# Patient Record
Sex: Female | Born: 1945 | Race: White | Hispanic: No | Smoking: Former smoker
Health system: Southern US, Community
[De-identification: ages and names within clinical notes are randomized; demographics above are authoritative.]

## PROBLEM LIST (undated history)

## (undated) DIAGNOSIS — R0600 Dyspnea, unspecified: Secondary | ICD-10-CM

## (undated) DIAGNOSIS — F419 Anxiety disorder, unspecified: Secondary | ICD-10-CM

## (undated) DIAGNOSIS — J189 Pneumonia, unspecified organism: Secondary | ICD-10-CM

## (undated) DIAGNOSIS — J449 Chronic obstructive pulmonary disease, unspecified: Secondary | ICD-10-CM

---

## 1997-11-28 ENCOUNTER — Ambulatory Visit (HOSPITAL_BASED_OUTPATIENT_CLINIC_OR_DEPARTMENT_OTHER): Admission: RE | Admit: 1997-11-28 | Discharge: 1997-11-28 | Payer: Self-pay | Admitting: Orthopedic Surgery

## 1998-10-19 ENCOUNTER — Encounter: Payer: Self-pay | Admitting: Orthopedic Surgery

## 1998-10-23 ENCOUNTER — Inpatient Hospital Stay (HOSPITAL_COMMUNITY): Admission: RE | Admit: 1998-10-23 | Discharge: 1998-10-27 | Payer: Self-pay | Admitting: Orthopedic Surgery

## 2017-08-15 ENCOUNTER — Emergency Department (HOSPITAL_COMMUNITY): Payer: Medicare Other

## 2017-08-15 ENCOUNTER — Inpatient Hospital Stay (HOSPITAL_COMMUNITY)
Admission: EM | Admit: 2017-08-15 | Discharge: 2017-09-10 | DRG: 208 | Disposition: E | Payer: Medicare Other | Attending: Pulmonary Disease | Admitting: Pulmonary Disease

## 2017-08-15 ENCOUNTER — Other Ambulatory Visit: Payer: Self-pay

## 2017-08-15 DIAGNOSIS — J84112 Idiopathic pulmonary fibrosis: Secondary | ICD-10-CM

## 2017-08-15 DIAGNOSIS — Z96659 Presence of unspecified artificial knee joint: Secondary | ICD-10-CM | POA: Diagnosis present

## 2017-08-15 DIAGNOSIS — I2722 Pulmonary hypertension due to left heart disease: Secondary | ICD-10-CM | POA: Diagnosis present

## 2017-08-15 DIAGNOSIS — J439 Emphysema, unspecified: Secondary | ICD-10-CM | POA: Diagnosis present

## 2017-08-15 DIAGNOSIS — R5381 Other malaise: Secondary | ICD-10-CM | POA: Diagnosis present

## 2017-08-15 DIAGNOSIS — I10 Essential (primary) hypertension: Secondary | ICD-10-CM | POA: Diagnosis present

## 2017-08-15 DIAGNOSIS — F419 Anxiety disorder, unspecified: Secondary | ICD-10-CM | POA: Diagnosis present

## 2017-08-15 DIAGNOSIS — Z833 Family history of diabetes mellitus: Secondary | ICD-10-CM

## 2017-08-15 DIAGNOSIS — J9620 Acute and chronic respiratory failure, unspecified whether with hypoxia or hypercapnia: Secondary | ICD-10-CM | POA: Diagnosis present

## 2017-08-15 DIAGNOSIS — Z515 Encounter for palliative care: Secondary | ICD-10-CM | POA: Diagnosis not present

## 2017-08-15 DIAGNOSIS — L89301 Pressure ulcer of unspecified buttock, stage 1: Secondary | ICD-10-CM | POA: Diagnosis present

## 2017-08-15 DIAGNOSIS — Z87891 Personal history of nicotine dependence: Secondary | ICD-10-CM | POA: Diagnosis not present

## 2017-08-15 DIAGNOSIS — J9621 Acute and chronic respiratory failure with hypoxia: Principal | ICD-10-CM

## 2017-08-15 DIAGNOSIS — Z9689 Presence of other specified functional implants: Secondary | ICD-10-CM

## 2017-08-15 DIAGNOSIS — I959 Hypotension, unspecified: Secondary | ICD-10-CM | POA: Diagnosis present

## 2017-08-15 DIAGNOSIS — Z66 Do not resuscitate: Secondary | ICD-10-CM | POA: Diagnosis not present

## 2017-08-15 DIAGNOSIS — G9341 Metabolic encephalopathy: Secondary | ICD-10-CM | POA: Diagnosis present

## 2017-08-15 DIAGNOSIS — E785 Hyperlipidemia, unspecified: Secondary | ICD-10-CM | POA: Diagnosis present

## 2017-08-15 DIAGNOSIS — J939 Pneumothorax, unspecified: Secondary | ICD-10-CM | POA: Diagnosis not present

## 2017-08-15 DIAGNOSIS — Z8249 Family history of ischemic heart disease and other diseases of the circulatory system: Secondary | ICD-10-CM | POA: Diagnosis not present

## 2017-08-15 DIAGNOSIS — J9690 Respiratory failure, unspecified, unspecified whether with hypoxia or hypercapnia: Secondary | ICD-10-CM | POA: Diagnosis present

## 2017-08-15 DIAGNOSIS — D649 Anemia, unspecified: Secondary | ICD-10-CM | POA: Diagnosis present

## 2017-08-15 DIAGNOSIS — J9383 Other pneumothorax: Secondary | ICD-10-CM | POA: Diagnosis present

## 2017-08-15 DIAGNOSIS — Z8673 Personal history of transient ischemic attack (TIA), and cerebral infarction without residual deficits: Secondary | ICD-10-CM

## 2017-08-15 DIAGNOSIS — L8990 Pressure ulcer of unspecified site, unspecified stage: Secondary | ICD-10-CM

## 2017-08-15 HISTORY — DX: Pneumonia, unspecified organism: J18.9

## 2017-08-15 HISTORY — DX: Anxiety disorder, unspecified: F41.9

## 2017-08-15 HISTORY — DX: Chronic obstructive pulmonary disease, unspecified: J44.9

## 2017-08-15 HISTORY — DX: Dyspnea, unspecified: R06.00

## 2017-08-15 LAB — BLOOD GAS, ARTERIAL
Acid-base deficit: 3 mmol/L — ABNORMAL HIGH (ref 0.0–2.0)
Bicarbonate: 21.5 mmol/L (ref 20.0–28.0)
DRAWN BY: 308601
FIO2: 100
LHR: 20 {breaths}/min
O2 SAT: 99.6 %
PATIENT TEMPERATURE: 36.3
PCO2 ART: 37 mmHg (ref 32.0–48.0)
PEEP: 8 cmH2O
PH ART: 7.378 (ref 7.350–7.450)
PO2 ART: 208 mmHg — AB (ref 83.0–108.0)
Pressure control: 14 cmH2O

## 2017-08-15 LAB — CBC WITH DIFFERENTIAL/PLATELET
Basophils Absolute: 0 10*3/uL (ref 0.0–0.1)
Basophils Relative: 0 %
Eosinophils Absolute: 0 10*3/uL (ref 0.0–0.7)
Eosinophils Relative: 0 %
HCT: 34.3 % — ABNORMAL LOW (ref 36.0–46.0)
Hemoglobin: 10.9 g/dL — ABNORMAL LOW (ref 12.0–15.0)
Lymphocytes Relative: 6 %
Lymphs Abs: 0.7 10*3/uL (ref 0.7–4.0)
MCH: 28.2 pg (ref 26.0–34.0)
MCHC: 31.8 g/dL (ref 30.0–36.0)
MCV: 88.9 fL (ref 78.0–100.0)
Monocytes Absolute: 0.3 10*3/uL (ref 0.1–1.0)
Monocytes Relative: 3 %
Neutro Abs: 11.4 10*3/uL — ABNORMAL HIGH (ref 1.7–7.7)
Neutrophils Relative %: 91 %
Platelets: 370 10*3/uL (ref 150–400)
RBC: 3.86 MIL/uL — ABNORMAL LOW (ref 3.87–5.11)
RDW: 15.3 % (ref 11.5–15.5)
WBC: 12.4 10*3/uL — ABNORMAL HIGH (ref 4.0–10.5)

## 2017-08-15 LAB — COMPREHENSIVE METABOLIC PANEL
ALBUMIN: 2.7 g/dL — AB (ref 3.5–5.0)
ALT: 11 U/L — AB (ref 14–54)
AST: 16 U/L (ref 15–41)
Alkaline Phosphatase: 52 U/L (ref 38–126)
Anion gap: 11 (ref 5–15)
BILIRUBIN TOTAL: 0.8 mg/dL (ref 0.3–1.2)
BUN: 18 mg/dL (ref 6–20)
CHLORIDE: 105 mmol/L (ref 101–111)
CO2: 27 mmol/L (ref 22–32)
CREATININE: 0.64 mg/dL (ref 0.44–1.00)
Calcium: 9.2 mg/dL (ref 8.9–10.3)
GFR calc Af Amer: >60 mL/min (ref >60)
GLUCOSE: 129 mg/dL — AB (ref 65–99)
POTASSIUM: 3.7 mmol/L (ref 3.5–5.1)
Sodium: 143 mmol/L (ref 135–145)
Total Protein: 6.5 g/dL (ref 6.5–8.1)

## 2017-08-15 LAB — I-STAT CG4 LACTIC ACID, ED: Lactic Acid, Venous: 0.86 mmol/L (ref 0.5–1.9)

## 2017-08-15 LAB — TROPONIN I: Troponin I: <0.03 ng/mL (ref <0.03)

## 2017-08-15 LAB — BRAIN NATRIURETIC PEPTIDE: B Natriuretic Peptide: 61.1 pg/mL (ref 0.0–100.0)

## 2017-08-15 LAB — MRSA PCR SCREENING: MRSA BY PCR: NEGATIVE

## 2017-08-15 LAB — PROCALCITONIN: Procalcitonin: 0.1 ng/mL

## 2017-08-15 LAB — TRIGLYCERIDES: TRIGLYCERIDES: 176 mg/dL — AB (ref <150)

## 2017-08-15 MED ORDER — PANTOPRAZOLE SODIUM 40 MG PO PACK
40.0000 mg | PACK | ORAL | Status: DC
  Filled 2017-08-15 (×2): qty 20

## 2017-08-15 MED ORDER — SODIUM CHLORIDE 0.9 % IV SOLN
INTRAVENOUS | Status: DC
  Administered 2017-08-15: 22:00:00 via INTRAVENOUS

## 2017-08-15 MED ORDER — IPRATROPIUM-ALBUTEROL 0.5-2.5 (3) MG/3ML IN SOLN
3.0000 mL | Freq: Four times a day (QID) | RESPIRATORY_TRACT | Status: DC
  Administered 2017-08-15 – 2017-08-17 (×8): 3 mL via RESPIRATORY_TRACT
  Filled 2017-08-15 (×8): qty 3

## 2017-08-15 MED ORDER — FENTANYL CITRATE (PF) 100 MCG/2ML IJ SOLN
75.0000 ug | Freq: Once | INTRAMUSCULAR | Status: AC
  Administered 2017-08-15: 75 ug via INTRAVENOUS
  Filled 2017-08-15: qty 2

## 2017-08-15 MED ORDER — ENOXAPARIN SODIUM 40 MG/0.4ML ~~LOC~~ SOLN
40.0000 mg | SUBCUTANEOUS | Status: DC
  Administered 2017-08-15 – 2017-08-17 (×3): 40 mg via SUBCUTANEOUS
  Filled 2017-08-15 (×4): qty 0.4

## 2017-08-15 MED ORDER — HYDROCORTISONE NA SUCCINATE PF 100 MG IJ SOLR
100.0000 mg | Freq: Once | INTRAMUSCULAR | Status: AC
  Administered 2017-08-15: 100 mg via INTRAVENOUS
  Filled 2017-08-15: qty 2

## 2017-08-15 MED ORDER — PREDNISONE 10 MG PO TABS: 20.0000 mg | ORAL_TABLET | Freq: Every day | ORAL | Status: DC

## 2017-08-15 MED ORDER — IOPAMIDOL (ISOVUE-370) INJECTION 76%
INTRAVENOUS | Status: AC
  Filled 2017-08-15: qty 100

## 2017-08-15 MED ORDER — SUCCINYLCHOLINE CHLORIDE 20 MG/ML IJ SOLN
60.0000 mg | Freq: Once | INTRAMUSCULAR | Status: AC
  Administered 2017-08-15: 60 mg via INTRAVENOUS
  Filled 2017-08-15: qty 3

## 2017-08-15 MED ORDER — FENTANYL CITRATE (PF) 100 MCG/2ML IJ SOLN
50.0000 ug | INTRAMUSCULAR | Status: DC | PRN
  Administered 2017-08-16: 50 ug via INTRAVENOUS
  Filled 2017-08-15: qty 2

## 2017-08-15 MED ORDER — FENTANYL CITRATE (PF) 100 MCG/2ML IJ SOLN: 50.0000 ug | INTRAMUSCULAR | Status: DC | PRN

## 2017-08-15 MED ORDER — ORAL CARE MOUTH RINSE
15.0000 mL | Freq: Four times a day (QID) | OROMUCOSAL | Status: DC
  Administered 2017-08-16 – 2017-08-18 (×10): 15 mL via OROMUCOSAL

## 2017-08-15 MED ORDER — VANCOMYCIN HCL IN DEXTROSE 1-5 GM/200ML-% IV SOLN
1000.0000 mg | INTRAVENOUS | Status: DC
  Administered 2017-08-15: 1000 mg via INTRAVENOUS
  Filled 2017-08-15: qty 200

## 2017-08-15 MED ORDER — ETOMIDATE 2 MG/ML IV SOLN
16.0000 mg | Freq: Once | INTRAVENOUS | Status: AC
  Administered 2017-08-15: 16 mg via INTRAVENOUS
  Filled 2017-08-15: qty 10

## 2017-08-15 MED ORDER — PROPOFOL 1000 MG/100ML IV EMUL
0.0000 ug/kg/min | INTRAVENOUS | Status: DC
  Administered 2017-08-15: 40 ug/kg/min via INTRAVENOUS
  Administered 2017-08-16 (×2): 20 ug/kg/min via INTRAVENOUS
  Administered 2017-08-17 (×2): 30 ug/kg/min via INTRAVENOUS
  Administered 2017-08-17: 20 ug/kg/min via INTRAVENOUS
  Administered 2017-08-18: 30 ug/kg/min via INTRAVENOUS
  Filled 2017-08-15 (×3): qty 100
  Filled 2017-08-15: qty 200
  Filled 2017-08-15 (×2): qty 100

## 2017-08-15 MED ORDER — BISACODYL 10 MG RE SUPP: 10.0000 mg | Freq: Every day | RECTAL | Status: DC | PRN

## 2017-08-15 MED ORDER — MIDAZOLAM HCL 2 MG/2ML IJ SOLN
1.0000 mg | INTRAMUSCULAR | Status: DC | PRN
  Administered 2017-08-16 – 2017-08-17 (×4): 1 mg via INTRAVENOUS
  Administered 2017-08-17: 2 mg via INTRAVENOUS
  Filled 2017-08-15 (×6): qty 2

## 2017-08-15 MED ORDER — FENTANYL CITRATE (PF) 100 MCG/2ML IJ SOLN
50.0000 ug | INTRAMUSCULAR | Status: DC | PRN
  Administered 2017-08-15: 50 ug via INTRAVENOUS
  Filled 2017-08-15: qty 2

## 2017-08-15 MED ORDER — ASPIRIN 81 MG PO CHEW: 81.0000 mg | CHEWABLE_TABLET | Freq: Every day | ORAL | Status: DC

## 2017-08-15 MED ORDER — SODIUM CHLORIDE 0.9 % IV BOLUS
1000.0000 mL | Freq: Once | INTRAVENOUS | Status: AC
  Administered 2017-08-15: 1000 mL via INTRAVENOUS

## 2017-08-15 MED ORDER — CHLORHEXIDINE GLUCONATE 0.12% ORAL RINSE (MEDLINE KIT)
15.0000 mL | Freq: Two times a day (BID) | OROMUCOSAL | Status: DC
  Administered 2017-08-15 – 2017-08-18 (×6): 15 mL via OROMUCOSAL

## 2017-08-15 MED ORDER — PROPOFOL 1000 MG/100ML IV EMUL
0.0000 ug/kg/min | INTRAVENOUS | Status: DC
  Administered 2017-08-15: 5 ug/kg/min via INTRAVENOUS
  Filled 2017-08-15: qty 100

## 2017-08-15 MED ORDER — DOCUSATE SODIUM 50 MG/5ML PO LIQD
100.0000 mg | Freq: Two times a day (BID) | ORAL | Status: DC | PRN
  Filled 2017-08-15: qty 10

## 2017-08-15 MED ORDER — ATORVASTATIN CALCIUM 20 MG PO TABS: 20.0000 mg | ORAL_TABLET | Freq: Every day | ORAL | Status: DC

## 2017-08-15 MED ORDER — IOPAMIDOL (ISOVUE-370) INJECTION 76%
100.0000 mL | Freq: Once | INTRAVENOUS | Status: AC | PRN
  Administered 2017-08-16: 69 mL via INTRAVENOUS

## 2017-08-15 MED ORDER — SODIUM CHLORIDE 0.9 % IV SOLN
2.0000 g | Freq: Once | INTRAVENOUS | Status: AC
  Administered 2017-08-15: 2 g via INTRAVENOUS
  Filled 2017-08-15: qty 2

## 2017-08-15 NOTE — ED Triage Notes (Signed)
Per [facility], patient received report from the family as the RN didn't know the history. Pt was transferred from [facility] to [facility] and since Tuesday was dx was pnemonia. Hx puml fibrosis. Respiratory distress started this AM. RR low 40s on bipap. 93% on bipap. desats quickly when taken off. Alert and oriented.

## 2017-08-15 NOTE — ED Notes (Signed)
Coming over from [facility]-states she was admitted there for respiratory failure-states she is unable to maintain saturation/O2-states she is currently on bipap-states xray showed pneumonia -states patient needs to be intubated and possibly admitted

## 2017-08-15 NOTE — ED Notes (Signed)
Respiratory aware of ABG

## 2017-08-15 NOTE — Progress Notes (Signed)
PHARMACY CONSULT: Lovenox for VTE prophylaxis   Wt: 55.8kg BMI:  21 Scr:  0.64 CrCl >30 ml/hr  H/H: 10.9/34.3 Pltc: 370  A/P:  Appropriate for standard dose Lovenox 40mg  sq q24h  No further dose adjustments needed, Pharmacy will sign off   Loralee PacasErin Dante Cooter, PharmD, BCPS Pager: [phone] 08/20/2017 6:00 PM

## 2017-08-15 NOTE — Progress Notes (Signed)
Pt on ventilator and unable to answer questions on nursing admission hx. Attempted to reach pt's sister by phone to get her assistance in answering the questions. There was no answer. Pt's rn notified. Briscoe Burns. Carleane Devon Pretty BSN, RN-BC Admissions RN 08/14/2017 8:02 PM

## 2017-08-15 NOTE — H&P (Signed)
PULMONARY / CRITICAL CARE MEDICINE   Name: Nancy Potts MRN: [id number] DOB: 09/18/1945    ADMISSION DATE:  08/24/2017  REFERRING MD:  Dr. Erma HeritageIsaacs, ER  CHIEF COMPLAINT:  Shortness of breath  HISTORY OF PRESENT ILLNESS:   Hx from chart.  72 yo female transferred from [facility] with respiratory failure.  She was hypoxic in ER and required intubation.  She was treated recently for pneumonia.  It does not appear she has ever been seen by a pulmonologist.  Her chest imaging studies show advanced pulmonary fibrosis with UIP pattern.  Apparently, she had discussion regarding goals of care and wanted aggressive measures.   PAST MEDICAL HISTORY :  COPD, Pulmonary fibrosis, Pulmonary Hypertension, Dysphagia, Hypertension, Hyperlipidemia, CVA, Rt renal cyst, Anxiety, Thrombocytosis, E coli bactermia  PAST SURGICAL HISTORY: Hysterectomy, knee replacement, tonsillectomy  Allergies  Allergen Reactions  . Amoxicillin Other (See Comments)    Unknown; has tolerated cephalosporins in the past and has had zosyn in ED without noted issue  . Fenofibrate Micronized     Other reaction(s): Dizziness (intolerance)  . Valproic Acid Other (See Comments)    Unknown    OUTPATIENT MEDICATIONS: Xanax, Ambien, Aspirin, Flonase, Fluoxetine 40 mg daily, Gabapentin 100 mg q6h prn, duoneb, levaquin, lipitor 20 mg daily, prednisone 20 mg daily, protonix, vancomycin   FAMILY HISTORY:  CAD, DM, HLD, Cancer, Dementia  SOCIAL HISTORY: Quit smoking in 2006  REVIEW OF SYSTEMS:   Unable to obtain  SUBJECTIVE:   VITAL SIGNS: BP (!) 100/44   Pulse 86   Temp 98.2 F (36.8 C) (Oral)   Resp (!) 21   Ht 5\' 4"  (1.626 m)   Wt 123 lb (55.8 kg)   SpO2 92%   BMI 21.11 kg/m   VENTILATOR SETTINGS: Vent Mode: PRVC FiO2 (%):  [100 %] 100 % Set Rate:  [10 bmp-15 bmp] 15 bmp Vt Set:  [440 mL] 440 mL PEEP:  [6 cmH20-10 cmH20] 10 cmH20 Plateau Pressure:  [20 cmH20] 20 cmH20  INTAKE / OUTPUT: No intake/output data  recorded.  PHYSICAL EXAMINATION:  General - sedated Eyes - pupils reactive ENT - no sinus tenderness, no oral exudate, no LAN Cardiac - regular, no murmur Chest - b/l crackles Abd - soft, non tender Ext - decreased muscle bulk Skin - no rashes Neuro - RASS -2  LABS:  BMET Recent Labs  Lab 03/24/18 1624  NA 143  K 3.7  CL 105  CO2 27  BUN 18  CREATININE 0.64  GLUCOSE 129*    Electrolytes Recent Labs  Lab 03/24/18 1624  CALCIUM 9.2    CBC Recent Labs  Lab 03/24/18 1624  WBC 12.4*  HGB 10.9*  HCT 34.3*  PLT 370    Coag's No results for input(s): APTT, INR in the last 168 hours.  Sepsis Markers Recent Labs  Lab 03/24/18 1631  LATICACIDVEN 0.86    ABG No results for input(s): PHART, PCO2ART, PO2ART in the last 168 hours.  Liver Enzymes Recent Labs  Lab 03/24/18 1624  AST 16  ALT 11*  ALKPHOS 52  BILITOT 0.8  ALBUMIN 2.7*    Cardiac Enzymes Recent Labs  Lab 03/24/18 1624  TROPONINI <0.03    Glucose No results for input(s): GLUCAP in the last 168 hours.  Imaging Dg Chest Portable 1 View  Result Date: 09/08/2017 CLINICAL DATA:  Shortness of breath. EXAM: PORTABLE CHEST 1 VIEW COMPARISON:  Radiograph of June 29, 2017. FINDINGS: The heart size and mediastinal contours are within normal limits.  Endotracheal tube is seen projected over tracheal air shadow with distal tip 3 cm above the carina. No pneumothorax or pleural effusion is noted. Stable diffuse reticular densities are noted throughout both lungs consistent with chronic interstitial lung disease, although acute superimposed edema or inflammation cannot be excluded. The visualized skeletal structures are unremarkable. IMPRESSION: Endotracheal tube in grossly good position. Stable reticular densities throughout both lungs consistent with chronic interstitial lung disease, although acute edema or inflammation cannot be excluded. Electronically Signed   By: Lupita Raider, M.D.   On:  September 01, 2017 16:56     STUDIES:   CULTURES: Blood 4/05 >>   ANTIBIOTICS: Vancomycin 4/05 >> 4/05 Cefepime 4/05 >> 4/05  SIGNIFICANT EVENTS: 4/05 Transfer from [facility] to [facility]  LINES/TUBES: ETT 4/05  DISCUSSION: 72 yo female former smoker with hx of advanced pulmonary fibrosis and COPD sent to ER from [facility] with worsening dyspnea and hypoxia.  She required intubation.  It is not clear whether there was an acute inciting event or this is the progression of her disease.  She was recently treated with ABx for bacteremia, and has been on prednisone.  She does not appear volume overloaded.  It is possible she could have had a PE.  She appears rather debilitated, and does not appear that she has walked anytime recently.  ASSESSMENT / PLAN:  Acute on chronic hypoxic respiratory failure. Advanced pulmonary fibrosis with UIP pattern. COPD. WHO group 2 pulmonary hypertension. - full vent support - change to pressure control - adjust PEEP/FiO2 to keep SpO2 88 to 95% - CT angio chest - continue prednisone 20 mg daily - defer additional ABx for now - check procalcitonin - scheduled BDs  Acute metabolic encephalopathy. Hx of CVA, anxiety. - RASS goal 0 to -1 - continue ASA - hold outpt fluoxetine, gabapentin  Hypotension. - likely from sedation and hypovolemia - IV fluids  Hx of HLD. - lipitor  DVT prophylaxis - lovenox SUP - protonix Nutrition - NPO Goals of care - full code  CC time 38 minutes  Coralyn Helling, MD [facility] Pulmonary/Critical Care 2017-09-01, 6:06 PM

## 2017-08-15 NOTE — Progress Notes (Signed)
This RN and another RN each tried twice to start IV on patient on both arms. Each attempt unsuccessful. Will continue to monitor

## 2017-08-15 NOTE — Progress Notes (Signed)
A consult was received from an ED physician for vancomy per pharmacy dosing.  The patient's profile has been reviewed for ht/wt/allergies/indication/available labs.   A one time order has been placed for Vancomycin 1g.  Further antibiotics/pharmacy consults should be ordered by admitting physician if indicated.                       Thank you, Jodelle GrossShade, Lillybeth Tal Elizabeth 09/04/2017  4:20 PM

## 2017-08-15 NOTE — ED Provider Notes (Signed)
[facility]-EMERGENCY DEPT Provider Note   CSN: [id number] Arrival date & time: 09/03/2017  1557     History   Chief Complaint Chief Complaint  Patient presents with  . Respiratory Distress    HPI Nancy Potts is a 72 y.o. female.  HPI   72 yo F with h/o chronic hypoxic resp failure, COPD, chronic 6L O2 requirement sent here from SNF for resp failure. Pt reportedly was diagnosed with PNA on Monday. She's been on Vancomycin and unknown 2nd ABX for this and has been progressively worsening. Earlier today, pt began to develop progressively worsening SOB/hypoxia and was increased from 6L to NRB, then BIPAP. She is extremely hypoxic when she is taken off. She is full code.  Currently, pt endorses feeling "worn out" and exhausted. She's having difficulty "keeping up." Denies any pain. Has had some nausea. No other complaints.  Level 5 caveat invoked as remainder of history, ROS, and physical exam limited due to patient's acuity.   No past medical history on file.   PMHx: COPD ILD Chronic resp failure on 6L [place] Seen at [facility]  PSHx: No relevant surgeries  SHx: Lives in SNF at [facility] FULL CODE  There are no active problems to display for this patient.  OB History   None      Home Medications    Prior to Admission medications   Not on File    Family History No family history on file.  Social History Social History   Tobacco Use  . Smoking status: Not on file  Substance Use Topics  . Alcohol use: Not on file  . Drug use: Not on file     Allergies   Amoxicillin; Fenofibrate micronized; and Valproic acid   Review of Systems Review of Systems  Constitutional: Positive for fatigue. Negative for chills and fever.  HENT: Negative for congestion, rhinorrhea and sore throat.   Eyes: Negative for visual disturbance.  Respiratory: Positive for cough and shortness of breath. Negative for wheezing.   Cardiovascular: Negative for chest  pain and leg swelling.  Gastrointestinal: Negative for abdominal pain, diarrhea, nausea and vomiting.  Genitourinary: Negative for dysuria, flank pain, vaginal bleeding and vaginal discharge.  Musculoskeletal: Negative for neck pain.  Skin: Negative for rash.  Allergic/Immunologic: Negative for immunocompromised state.  Neurological: Positive for weakness. Negative for syncope and headaches.  Hematological: Does not bruise/bleed easily.  All other systems reviewed and are negative.    Physical Exam Updated Vital Signs BP (!) 106/92   Pulse 93   Temp 98.2 F (36.8 C) (Oral)   Resp 18   Ht 5\' 4"  (1.626 m)   Wt 55.8 kg (123 lb)   SpO2 97%   BMI 21.11 kg/m   Physical Exam  Constitutional: She is oriented to person, place, and time. She appears well-developed and well-nourished. She has a sickly appearance. She appears distressed.  HENT:  Head: Normocephalic and atraumatic.  Eyes: Conjunctivae are normal.  Neck: Neck supple.  Cardiovascular: Normal rate, regular rhythm and normal heart sounds. Exam reveals no friction rub.  No murmur heard. Pulmonary/Chest: Accessory muscle usage present. Tachypnea noted. She is in respiratory distress. She has decreased breath sounds. She has no wheezes. She has rhonchi in the right middle field, the right lower field, the left middle field and the left lower field. She has no rales.  Abdominal: She exhibits no distension.  Musculoskeletal: She exhibits no edema.  Neurological: She is alert and oriented to person, place, and time.  She exhibits normal muscle tone.  Skin: Skin is warm. Capillary refill takes less than 2 seconds.  Psychiatric: She has a normal mood and affect.  Nursing note and vitals reviewed.    ED Treatments / Results  Labs (all labs ordered are listed, but only abnormal results are displayed) Labs Reviewed  CBC WITH DIFFERENTIAL/PLATELET - Abnormal; Notable for the following components:      Result Value   WBC 12.4 (*)      RBC 3.86 (*)    Hemoglobin 10.9 (*)    HCT 34.3 (*)    Neutro Abs 11.4 (*)    All other components within normal limits  COMPREHENSIVE METABOLIC PANEL - Abnormal; Notable for the following components:   Glucose, Bld 129 (*)    Albumin 2.7 (*)    ALT 11 (*)    All other components within normal limits  CULTURE, BLOOD (ROUTINE X 2)  CULTURE, BLOOD (ROUTINE X 2)  URINE CULTURE  TROPONIN I  URINALYSIS, ROUTINE W REFLEX MICROSCOPIC  BRAIN NATRIURETIC PEPTIDE  TRIGLYCERIDES  BLOOD GAS, ARTERIAL  I-STAT CG4 LACTIC ACID, ED    EKG EKG Interpretation  Date/Time:  Friday August 15 2017 17:12:43 EDT Ventricular Rate:  95 PR Interval:    QRS Duration: 73 QT Interval:  380 QTC Calculation: 478 R Axis:   38 Text Interpretation:  Sinus rhythm Nonspecific T abnormalities, anterior leads No significant change since last tracing Confirmed by Shaune PollackIsaacs, Trace Cederberg [phone]) on 09/03/2017 5:28:57 PM   Radiology Dg Chest Portable 1 View  Result Date: 09/09/2017 CLINICAL DATA:  Shortness of breath. EXAM: PORTABLE CHEST 1 VIEW COMPARISON:  Radiograph of June 29, 2017. FINDINGS: The heart size and mediastinal contours are within normal limits. Endotracheal tube is seen projected over tracheal air shadow with distal tip 3 cm above the carina. No pneumothorax or pleural effusion is noted. Stable diffuse reticular densities are noted throughout both lungs consistent with chronic interstitial lung disease, although acute superimposed edema or inflammation cannot be excluded. The visualized skeletal structures are unremarkable. IMPRESSION: Endotracheal tube in grossly good position. Stable reticular densities throughout both lungs consistent with chronic interstitial lung disease, although acute edema or inflammation cannot be excluded. Electronically Signed   By: Lupita RaiderJames  Green Jr, M.D.   On: March 23, 2018 16:56    Procedures Procedure Name: Intubation Date/Time: 08/16/2017 4:37 PM Performed by: Shaune PollackIsaacs, Lear Carstens,  MD Pre-anesthesia Checklist: Patient identified, Patient being monitored, Emergency Drugs available, Timeout performed and Suction available Oxygen Delivery Method: Non-rebreather mask Preoxygenation: Pre-oxygenation with 100% oxygen Induction Type: Rapid sequence Ventilation: Mask ventilation without difficulty Laryngoscope Size: Glidescope and 3 Grade View: Grade I Tube size: 7.5 mm Number of attempts: 1 Airway Equipment and Method: Video-laryngoscopy and Rigid stylet Placement Confirmation: ETT inserted through vocal cords under direct vision,  CO2 detector and Breath sounds checked- equal and bilateral Secured at: 21 cm Tube secured with: ETT Ress Dental Injury: Teeth and Oropharynx as per pre-operative assessment  Difficulty Due To: Difficulty was unanticipated Future Recommendations: Recommend- induction with short-acting agent, and alternative techniques readily available     .Critical Care Performed by: Shaune PollackIsaacs, Rosaire Cueto, MD Authorized by: Shaune PollackIsaacs, Delvon Chipps, MD   Critical care provider statement:    Critical care time (minutes):  45   Critical care time was exclusive of:  Separately billable procedures and treating other patients and teaching time   Critical care was necessary to treat or prevent imminent or life-threatening deterioration of the following conditions:  Respiratory failure and circulatory failure  Critical care was time spent personally by me on the following activities:  Development of treatment plan with patient or surrogate, discussions with consultants, evaluation of patient's response to treatment, examination of patient, obtaining history from patient or surrogate, ordering and performing treatments and interventions, ordering and review of laboratory studies, ordering and review of radiographic studies, pulse oximetry, re-evaluation of patient's condition and review of old charts   I assumed direction of critical care for this patient from another provider  in my specialty: no     (including critical care time)  Medications Ordered in ED Medications  sodium chloride 0.9 % bolus 1,000 mL (1,000 mLs Intravenous New Bag/Given 08/11/2017 1635)  vancomycin (VANCOCIN) IVPB 1000 mg/200 mL premix (1,000 mg Intravenous New Bag/Given 09/01/2017 1727)  propofol (DIPRIVAN) 1000 MG/100ML infusion (35 mcg/kg/min  55.8 kg Intravenous Rate/Dose Change 09/03/2017 1721)  fentaNYL (SUBLIMAZE) injection 50 mcg (50 mcg Intravenous Given 09/07/2017 1700)  fentaNYL (SUBLIMAZE) injection 50 mcg (has no administration in time range)  etomidate (AMIDATE) injection 16 mg (16 mg Intravenous Given 09/08/2017 1629)  succinylcholine (ANECTINE) injection 60 mg (60 mg Intravenous Given 08/17/2017 1629)  ceFEPIme (MAXIPIME) 2 g in sodium chloride 0.9 % 100 mL IVPB (0 g Intravenous Stopped 09/06/2017 1726)  hydrocortisone sodium succinate (SOLU-CORTEF) 100 MG injection 100 mg (100 mg Intravenous Given 09/08/2017 1635)  fentaNYL (SUBLIMAZE) injection 75 mcg (75 mcg Intravenous Given 08/14/2017 1641)     Initial Impression / Assessment and Plan / ED Course  I have reviewed the triage vital signs and the nursing notes.  Pertinent labs & imaging results that were available during my care of the patient were reviewed by me and considered in my medical decision making (see chart for details).  Clinical Course as of Aug 16 1730  Fri Aug 15, 2017  5160 72 year old female with extensive past medical history as above here with acute on chronic hypoxic respiratory failure secondary to pneumonia.  On arrival, patient dyspneic, fatigued, and slightly drowsy.  I was able to have a discussion with her and decision made to intubate.  Patient intubated as above uneventfully and tolerated very well.  Will start broad-spectrum antibiotics, fluids, and admit.   [CI]  1658 Patient started on propofol and fentanyl.  Portable chest x-ray shows that ET tube is in appropriate place.  There are bilateral coarse interstitial markings  with patchy bibasilar opacifications.  Continue antibiotics.  Intensivist consulted.   [CI]  1727 Lab work is overall reassuring.  She has mild leukocytosis, but normal lactic acid.  No signs of severe sepsis.  Patient improved on sedation.  Admit to the ICU.   [CI]    Clinical Course User Index [CI] Shaune Pollack, MD     Final Clinical Impressions(s) / ED Diagnoses   Final diagnoses:  Acute on chronic respiratory failure with hypoxia [facility])      Shaune Pollack, MD 08/19/2017 1732

## 2017-08-15 NOTE — ED Notes (Signed)
ED TO INPATIENT HANDOFF REPORT  Name/Age/Gender Nancy Potts 72 y.o. female  Code Status    Code Status Orders  (From admission, onward)        Start     Ordered   09/08/2017 1740  Full code  Continuous     08/27/2017 1739    Code Status History    This patient has a current code status but no historical code status.      Home/SNF/Other SNF-[facility]  Chief Complaint No admission diagnoses are documented for this encounter.  Level of Care/Admitting Diagnosis ED Disposition    ED Disposition Condition [facility] Area: [place] [[id number]]  Level of Care: ICU [6]  Diagnosis: Acute on chronic respiratory failure [facility]) [[id number]]  Admitting Physician: Chesley Mires [[id number]]  Attending Physician: Chesley Mires [[id number]]  Estimated length of stay: > 1 week  Certification:: I certify this patient will need inpatient services for at least 2 midnights  PT Class (Do Not Modify): Inpatient [101]  PT Acc Code (Do Not Modify): Private [1]       Medical History No past medical history on file.  Allergies Allergies  Allergen Reactions  . Amoxicillin Other (See Comments)    Unknown; has tolerated cephalosporins in the past and has had zosyn in ED without noted issue  . Fenofibrate Micronized     Other reaction(s): Dizziness (intolerance)  . Valproic Acid Other (See Comments)    Unknown    IV Location/Drains/Wounds Patient Lines/Drains/Airways Status   Active Line/Drains/Airways    Name:   Placement date:   Placement time:   Site:   Days:   Peripheral IV Right Forearm   -    -    Forearm      Peripheral IV 09/08/2017 Right Arm   08/17/2017    1616    Arm   less than 1   Airway 7.5 mm   09/04/2017    1631     less than 1          Labs/Imaging Results for orders placed or performed during the hospital encounter of 09/01/2017 (from the past 48 hour(s))  CBC with Differential     Status: Abnormal   Collection Time: 09/07/2017  4:24 PM  Result Value  Ref Range   WBC 12.4 (H) 4.0 - 10.5 K/uL   RBC 3.86 (L) 3.87 - 5.11 MIL/uL   Hemoglobin 10.9 (L) 12.0 - 15.0 g/dL   HCT 34.3 (L) 36.0 - 46.0 %   MCV 88.9 78.0 - 100.0 fL   MCH 28.2 26.0 - 34.0 pg   MCHC 31.8 30.0 - 36.0 g/dL   RDW 15.3 11.5 - 15.5 %   Platelets 370 150 - 400 K/uL   Neutrophils Relative % 91 %   Neutro Abs 11.4 (H) 1.7 - 7.7 K/uL   Lymphocytes Relative 6 %   Lymphs Abs 0.7 0.7 - 4.0 K/uL   Monocytes Relative 3 %   Monocytes Absolute 0.3 0.1 - 1.0 K/uL   Eosinophils Relative 0 %   Eosinophils Absolute 0.0 0.0 - 0.7 K/uL   Basophils Relative 0 %   Basophils Absolute 0.0 0.0 - 0.1 K/uL    Comment: Performed at [facility], [place] [address], [place], [place] [address]  Comprehensive metabolic panel     Status: Abnormal   Collection Time: 08/26/2017  4:24 PM  Result Value Ref Range   Sodium 143 135 - 145 mmol/L   Potassium 3.7  3.5 - 5.1 mmol/L   Chloride 105 101 - 111 mmol/L   CO2 27 22 - 32 mmol/L   Glucose, Bld 129 (H) 65 - 99 mg/dL   BUN 18 6 - 20 mg/dL   Creatinine, Ser 0.64 0.44 - 1.00 mg/dL   Calcium 9.2 8.9 - 10.3 mg/dL   Total Protein 6.5 6.5 - 8.1 g/dL   Albumin 2.7 (L) 3.5 - 5.0 g/dL   AST 16 15 - 41 U/L   ALT 11 (L) 14 - 54 U/L   Alkaline Phosphatase 52 38 - 126 U/L   Total Bilirubin 0.8 0.3 - 1.2 mg/dL   GFR calc non Af Amer >60 >60 mL/min   GFR calc Af Amer >60 >60 mL/min    Comment: (NOTE) The eGFR has been calculated using the CKD EPI equation. This calculation has not been validated in all clinical situations. eGFR's persistently <60 mL/min signify possible Chronic Kidney Disease.    Anion gap 11 5 - 15    Comment: Performed at [facility], [place] [address], [place], [place] [address]  Troponin I     Status: None   Collection Time: 08/23/2017  4:24 PM  Result Value Ref Range   Troponin I <0.03 <0.03 ng/mL    Comment: Performed at [facility], [place] [address], [place], [place] [address]   I-Stat CG4 Lactic Acid, ED     Status: None   Collection Time: 08/22/2017  4:31 PM  Result Value Ref Range   Lactic Acid, Venous 0.86 0.5 - 1.9 mmol/L   Dg Chest Portable 1 View  Result Date: 09/04/2017 CLINICAL DATA:  Shortness of breath. EXAM: PORTABLE CHEST 1 VIEW COMPARISON:  Radiograph of June 29, 2017. FINDINGS: The heart size and mediastinal contours are within normal limits. Endotracheal tube is seen projected over tracheal air shadow with distal tip 3 cm above the carina. No pneumothorax or pleural effusion is noted. Stable diffuse reticular densities are noted throughout both lungs consistent with chronic interstitial lung disease, although acute superimposed edema or inflammation cannot be excluded. The visualized skeletal structures are unremarkable. IMPRESSION: Endotracheal tube in grossly good position. Stable reticular densities throughout both lungs consistent with chronic interstitial lung disease, although acute edema or inflammation cannot be excluded. Electronically Signed   By: Marijo Conception, M.D.   On: 08/17/2017 16:56    Pending Labs Unresulted Labs (From admission, onward)   Start     Ordered   08/16/17 7867  Basic metabolic panel  Tomorrow morning,   R     08/12/2017 1745   08/16/17 0500  CBC  Tomorrow morning,   R     09/03/2017 1745   08/23/2017 1910  Blood gas, arterial  Once,   R    Comments:  Original post 1 hour ABG not completed due to MD placing new vent orders.   Question:  Room air or oxygen  Answer:  Oxygen   08/16/2017 1750   09/02/2017 1744  Triglycerides  (propofol (DIPRIVAN))  Every 72 hours,   R    Comments:  While on propofol (DIPRIVAN)    08/24/2017 1744   08/11/2017 1615  Brain natriuretic peptide  Once,   R     09/06/2017 1614   08/30/2017 1613  Blood culture (routine x 2)  BLOOD CULTURE X 2,   STAT    Question:  Patient immune status  Answer:  Normal   08/25/2017 1613      Vitals/Pain Today's Vitals  09/03/2017 1729 08/17/2017 1735 09/05/2017 1740  08/19/2017 1745  BP:  103/67 (!) 100/44 (!) 99/49  Pulse:  88 86 80  Resp:  (!) 44 (!) 21 (!) 25  Temp:      TempSrc:      SpO2:  (!) 89% 92% 91%  Weight:      Height:      PainSc: 0-No pain       Isolation Precautions No active isolations  Medications Medications  fentaNYL (SUBLIMAZE) injection 50 mcg (has no administration in time range)  propofol (DIPRIVAN) 1000 MG/100ML infusion (has no administration in time range)  midazolam (VERSED) injection 1 mg (has no administration in time range)  docusate (COLACE) 50 MG/5ML liquid 100 mg (has no administration in time range)  bisacodyl (DULCOLAX) suppository 10 mg (has no administration in time range)  chlorhexidine gluconate (MEDLINE KIT) (PERIDEX) 0.12 % solution 15 mL (has no administration in time range)  MEDLINE mouth rinse (has no administration in time range)  pantoprazole sodium (PROTONIX) 40 mg/20 mL oral suspension 40 mg (has no administration in time range)  0.9 %  sodium chloride infusion (has no administration in time range)  etomidate (AMIDATE) injection 16 mg (16 mg Intravenous Given 09/06/2017 1629)  succinylcholine (ANECTINE) injection 60 mg (60 mg Intravenous Given 08/14/2017 1629)  sodium chloride 0.9 % bolus 1,000 mL (1,000 mLs Intravenous New Bag/Given 08/12/2017 1635)  ceFEPIme (MAXIPIME) 2 g in sodium chloride 0.9 % 100 mL IVPB (0 g Intravenous Stopped 09/08/2017 1726)  hydrocortisone sodium succinate (SOLU-CORTEF) 100 MG injection 100 mg (100 mg Intravenous Given 08/13/2017 1635)  fentaNYL (SUBLIMAZE) injection 75 mcg (75 mcg Intravenous Given 08/12/2017 1641)    Mobility Bed rest

## 2017-08-15 NOTE — Progress Notes (Signed)
THis RN and another RN gave multiple attempts at placing NG or OG tube with different sizes used. Each attempt unsuccessful. Will continue to monitor

## 2017-08-15 NOTE — ED Notes (Signed)
Bed: RESA Expected date:  Expected time:  Means of arrival:  Comments: [facility]-respiratory distress

## 2017-08-15 NOTE — ED Notes (Signed)
Patient pulling at tubes and lines after ET tube insertion. Fent and propofol given and prop titrated up.

## 2017-08-16 ENCOUNTER — Inpatient Hospital Stay (HOSPITAL_COMMUNITY): Payer: Medicare Other

## 2017-08-16 ENCOUNTER — Encounter (HOSPITAL_COMMUNITY): Payer: Self-pay | Admitting: Radiology

## 2017-08-16 ENCOUNTER — Other Ambulatory Visit: Payer: Self-pay

## 2017-08-16 DIAGNOSIS — L8990 Pressure ulcer of unspecified site, unspecified stage: Secondary | ICD-10-CM

## 2017-08-16 LAB — BASIC METABOLIC PANEL
ANION GAP: 9 (ref 5–15)
BUN: 17 mg/dL (ref 6–20)
CHLORIDE: 115 mmol/L — AB (ref 101–111)
CO2: 22 mmol/L (ref 22–32)
Calcium: 8.1 mg/dL — ABNORMAL LOW (ref 8.9–10.3)
Creatinine, Ser: 0.62 mg/dL (ref 0.44–1.00)
GFR calc non Af Amer: >60 mL/min (ref >60)
Glucose, Bld: 103 mg/dL — ABNORMAL HIGH (ref 65–99)
Potassium: 3.4 mmol/L — ABNORMAL LOW (ref 3.5–5.1)
Sodium: 146 mmol/L — ABNORMAL HIGH (ref 135–145)

## 2017-08-16 LAB — BLOOD GAS, ARTERIAL
Acid-base deficit: 2.2 mmol/L — ABNORMAL HIGH (ref 0.0–2.0)
Bicarbonate: 21.6 mmol/L (ref 20.0–28.0)
DRAWN BY: 308601
FIO2: 70
O2 Saturation: 97.2 %
PEEP: 8 cmH2O
PO2 ART: 91.2 mmHg (ref 83.0–108.0)
Patient temperature: 36.8
Pressure control: 14 cmH2O
RATE: 20 resp/min
pCO2 arterial: 34.9 mmHg (ref 32.0–48.0)
pH, Arterial: 7.409 (ref 7.350–7.450)

## 2017-08-16 LAB — CBC
HEMATOCRIT: 29.6 % — AB (ref 36.0–46.0)
HEMOGLOBIN: 9.1 g/dL — AB (ref 12.0–15.0)
MCH: 28.3 pg (ref 26.0–34.0)
MCHC: 30.7 g/dL (ref 30.0–36.0)
MCV: 92.2 fL (ref 78.0–100.0)
Platelets: 375 10*3/uL (ref 150–400)
RBC: 3.21 MIL/uL — AB (ref 3.87–5.11)
RDW: 15.5 % (ref 11.5–15.5)
WBC: 12.7 10*3/uL — ABNORMAL HIGH (ref 4.0–10.5)

## 2017-08-16 LAB — PROCALCITONIN

## 2017-08-16 LAB — GLUCOSE, CAPILLARY: Glucose-Capillary: 111 mg/dL — ABNORMAL HIGH (ref 65–99)

## 2017-08-16 MED ORDER — FENTANYL CITRATE (PF) 100 MCG/2ML IJ SOLN
100.0000 ug | INTRAMUSCULAR | Status: DC | PRN
Start: 2017-08-16 — End: 2017-08-17
  Administered 2017-08-17 (×2): 100 ug via INTRAVENOUS
  Filled 2017-08-16 (×3): qty 2

## 2017-08-16 MED ORDER — PANTOPRAZOLE SODIUM 40 MG IV SOLR
40.0000 mg | INTRAVENOUS | Status: DC
  Administered 2017-08-16 – 2017-08-18 (×3): 40 mg via INTRAVENOUS
  Filled 2017-08-16 (×3): qty 40

## 2017-08-16 MED ORDER — ASPIRIN 300 MG RE SUPP
150.0000 mg | Freq: Every day | RECTAL | Status: DC
  Administered 2017-08-16 – 2017-08-17 (×2): 150 mg via RECTAL
  Filled 2017-08-16 (×2): qty 1

## 2017-08-16 MED ORDER — IOPAMIDOL (ISOVUE-370) INJECTION 76%
INTRAVENOUS | Status: AC
  Administered 2017-08-16: 11:00:00
  Filled 2017-08-16: qty 100

## 2017-08-16 MED ORDER — SODIUM CHLORIDE 0.9 % IV BOLUS
1000.0000 mL | Freq: Once | INTRAVENOUS | Status: AC
  Administered 2017-08-16: 1000 mL via INTRAVENOUS

## 2017-08-16 MED ORDER — METHYLPREDNISOLONE SODIUM SUCC 40 MG IJ SOLR
20.0000 mg | Freq: Two times a day (BID) | INTRAMUSCULAR | Status: DC
  Administered 2017-08-16 – 2017-08-17 (×3): 20 mg via INTRAVENOUS
  Filled 2017-08-16 (×3): qty 1

## 2017-08-16 NOTE — Progress Notes (Signed)
PULMONARY / CRITICAL CARE MEDICINE   Name: Nancy Potts MRN: [id number] DOB: 12/05/1945    ADMISSION DATE:  08/30/2017  REFERRING MD:  Dr. Erma HeritageIsaacs, ER  CHIEF COMPLAINT:  Shortness of breath  HISTORY OF PRESENT ILLNESS:   72 yo female transferred from [facility] with respiratory failure.  She was hypoxic in ER and required intubation.  She has advanced IPF and COPD.  Had recent tx for PNA and COPD exacerbation.  PMHx of HTN, HLD, Anxiety.  SUBJECTIVE:  Denies chest pain.  VITAL SIGNS: BP (!) 90/32   Pulse 71   Temp 98.1 F (36.7 C)   Resp 20   Ht 5\' 4"  (1.626 m)   Wt 123 lb (55.8 kg)   SpO2 93%   BMI 21.11 kg/m   VENTILATOR SETTINGS: Vent Mode: PCV FiO2 (%):  [60 %-100 %] 70 % Set Rate:  [10 bmp-20 bmp] 20 bmp Vt Set:  [440 mL] 440 mL PEEP:  [6 cmH20-10 cmH20] 8 cmH20 Plateau Pressure:  [20 cmH20-23 cmH20] 23 cmH20  INTAKE / OUTPUT: I/O last 3 completed shifts: In: 3072.7 [I.V.:739.3; IV Piggyback:2333.3] Out: -   PHYSICAL EXAMINATION:  General - sedated Eyes - pupils reactive ENT - ETT in place Cardiac - regular, no murmur Chest - b/l crackles Abd - soft, non tender Ext - no edema Skin - no rashes Neuro - RASS 0  LABS:  BMET Recent Labs  Lab 12-04-17 1624 08/16/17 0310  NA 143 146*  K 3.7 3.4*  CL 105 115*  CO2 27 22  BUN 18 17  CREATININE 0.64 0.62  GLUCOSE 129* 103*    Electrolytes Recent Labs  Lab 12-04-17 1624 08/16/17 0310  CALCIUM 9.2 8.1*    CBC Recent Labs  Lab 12-04-17 1624 08/16/17 0310  WBC 12.4* 12.7*  HGB 10.9* 9.1*  HCT 34.3* 29.6*  PLT 370 375    Coag's No results for input(s): APTT, INR in the last 168 hours.  Sepsis Markers Recent Labs  Lab 12-04-17 1631 12-04-17 1906 08/16/17 0310  LATICACIDVEN 0.86  --   --   PROCALCITON  --  0.10 <0.10    ABG Recent Labs  Lab 12-04-17 1935 08/16/17 0533  PHART 7.378 7.409  PCO2ART 37.0 34.9  PO2ART 208* 91.2    Liver Enzymes Recent Labs  Lab 12-04-17 1624   AST 16  ALT 11*  ALKPHOS 52  BILITOT 0.8  ALBUMIN 2.7*    Cardiac Enzymes Recent Labs  Lab 12-04-17 1624  TROPONINI <0.03    Glucose No results for input(s): GLUCAP in the last 168 hours.  Imaging Dg Chest Port 1 View  Result Date: 08/16/2017 CLINICAL DATA:  Respiratory failure EXAM: PORTABLE CHEST 1 VIEW COMPARISON:  2017/09/24 FINDINGS: Endotracheal tube with tip measuring 2.4 cm above the carina. Shallow inspiration with atelectasis in the lung bases. Heart size is normal. Diffuse interstitial pattern to the lungs likely represents chronic fibrosis, possibly usual interstitial pneumonitis. No focal consolidation. No blunting of costophrenic angles. No pneumothorax. Calcification of the aorta. IMPRESSION: No change since previous study. Endotracheal tube appears in satisfactory position. Low lung volumes with diffuse fibrosis, likely chronic. Electronically Signed   By: Burman NievesWilliam  Stevens M.D.   On: 08/16/2017 05:54   Dg Chest Portable 1 View  Result Date: 08/25/2017 CLINICAL DATA:  Shortness of breath. EXAM: PORTABLE CHEST 1 VIEW COMPARISON:  Radiograph of June 29, 2017. FINDINGS: The heart size and mediastinal contours are within normal limits. Endotracheal tube is seen projected over tracheal air  shadow with distal tip 3 cm above the carina. No pneumothorax or pleural effusion is noted. Stable diffuse reticular densities are noted throughout both lungs consistent with chronic interstitial lung disease, although acute superimposed edema or inflammation cannot be excluded. The visualized skeletal structures are unremarkable. IMPRESSION: Endotracheal tube in grossly good position. Stable reticular densities throughout both lungs consistent with chronic interstitial lung disease, although acute edema or inflammation cannot be excluded. Electronically Signed   By: Lupita Raider, M.D.   On: 09/08/2017 16:56     STUDIES:   CULTURES: Blood 4/05 >>   ANTIBIOTICS: Vancomycin 4/05 >>  4/05 Cefepime 4/05 >> 4/05  SIGNIFICANT EVENTS: 4/05 Transfer from [facility] to [facility]  LINES/TUBES: ETT 4/05 >>  DISCUSSION: 72 yo female former smoker with hx of advanced pulmonary fibrosis and COPD sent to ER from [facility] with worsening dyspnea and hypoxia.  She required intubation.  It is not clear whether there was an acute inciting event or this is the progression of her disease.  She was recently treated with ABx for bacteremia, and has been on prednisone.  She does not appear volume overloaded.  It is possible she could have had a PE.  She appears rather debilitated otherwise.  ASSESSMENT / PLAN:  Acute on chronic hypoxic respiratory failure. Advanced pulmonary fibrosis with UIP pattern. COPD. WHO group 2 pulmonary hypertension. - full vent support - f/u CT angio chest - adjust PEEP/FiO2 to keep SpO2 88 to 95% - solumedrol 20 mg q12h - procalcitonin negative >> hold Abx - scheduled BDs  Acute metabolic encephalopathy. Hx of CVA, anxiety. - RASS goal 0 to -1 - continue ASA >> will need to give PR until able to get NG/OG tube - hold outpt fluoxetine, gabapentin  Hypotension. - likely from sedation and hypovolemia - continue IV fluids  Hx of HLD. - hold lipitor until able to get NG/OG tube  Difficulty inserting NG/OG tube. - will assess CT chest and then decide if she needs to have tube placed with radiology versus getting panda tube  DVT prophylaxis - lovenox SUP - protonix Nutrition - NPO Goals of care - full code  CC time 31 minutes  Coralyn Helling, MD [facility] Pulmonary/Critical Care 08/16/2017, 9:49 AM

## 2017-08-16 NOTE — Progress Notes (Signed)
Patient transported to CT without complication.  

## 2017-08-16 NOTE — Progress Notes (Signed)
eLink Physician-Brief Progress Note Patient Name: Nancy AmisShirley Potts DOB: 11/09/1945 MRN: [id number]   Date of Service  08/16/2017  HPI/Events of Note  decreased BP.  Patient on vent and appears comfortable.  eICU Interventions  Stop propofol Fentanyl as needed for comfort Continue to monitor bp     Intervention Category Intermediate Interventions: Hypotension - evaluation and management  Henry RusselSMITH, Tanae Petrosky, P 08/16/2017, 12:55 AM

## 2017-08-17 ENCOUNTER — Inpatient Hospital Stay (HOSPITAL_COMMUNITY): Payer: Medicare Other

## 2017-08-17 DIAGNOSIS — J939 Pneumothorax, unspecified: Secondary | ICD-10-CM

## 2017-08-17 DIAGNOSIS — J9621 Acute and chronic respiratory failure with hypoxia: Principal | ICD-10-CM

## 2017-08-17 LAB — BASIC METABOLIC PANEL
ANION GAP: 8 (ref 5–15)
BUN: 13 mg/dL (ref 6–20)
CALCIUM: 8.7 mg/dL — AB (ref 8.9–10.3)
CO2: 23 mmol/L (ref 22–32)
Chloride: 116 mmol/L — ABNORMAL HIGH (ref 101–111)
Creatinine, Ser: 0.6 mg/dL (ref 0.44–1.00)
GFR calc Af Amer: >60 mL/min (ref >60)
GLUCOSE: 121 mg/dL — AB (ref 65–99)
Potassium: 3.2 mmol/L — ABNORMAL LOW (ref 3.5–5.1)
Sodium: 147 mmol/L — ABNORMAL HIGH (ref 135–145)

## 2017-08-17 LAB — CBC
HCT: 25.8 % — ABNORMAL LOW (ref 36.0–46.0)
HEMOGLOBIN: 8 g/dL — AB (ref 12.0–15.0)
MCH: 28.3 pg (ref 26.0–34.0)
MCHC: 31 g/dL (ref 30.0–36.0)
MCV: 91.2 fL (ref 78.0–100.0)
Platelets: 304 10*3/uL (ref 150–400)
RBC: 2.83 MIL/uL — ABNORMAL LOW (ref 3.87–5.11)
RDW: 15.3 % (ref 11.5–15.5)
WBC: 8.3 10*3/uL (ref 4.0–10.5)

## 2017-08-17 LAB — PROCALCITONIN

## 2017-08-17 MED ORDER — KCL IN DEXTROSE-NACL 20-5-0.45 MEQ/L-%-% IV SOLN
INTRAVENOUS | Status: DC
  Administered 2017-08-17 – 2017-08-18 (×2): via INTRAVENOUS
  Filled 2017-08-17 (×2): qty 1000

## 2017-08-17 MED ORDER — IPRATROPIUM-ALBUTEROL 0.5-2.5 (3) MG/3ML IN SOLN: 3.0000 mL | RESPIRATORY_TRACT | Status: DC | PRN

## 2017-08-17 MED ORDER — POTASSIUM CHLORIDE 10 MEQ/100ML IV SOLN
10.0000 meq | INTRAVENOUS | Status: AC
  Administered 2017-08-17 (×4): 10 meq via INTRAVENOUS
  Filled 2017-08-17 (×4): qty 100

## 2017-08-17 MED ORDER — SODIUM CHLORIDE 0.9 % IV SOLN
25.0000 ug/h | INTRAVENOUS | Status: DC
  Filled 2017-08-17: qty 50

## 2017-08-17 MED ORDER — MIDAZOLAM HCL 2 MG/2ML IJ SOLN: 2.0000 mg | Freq: Once | INTRAMUSCULAR | Status: DC

## 2017-08-17 MED ORDER — FENTANYL 2500MCG IN NS 250ML (10MCG/ML) PREMIX INFUSION
25.0000 ug/h | INTRAVENOUS | Status: DC
  Administered 2017-08-17 – 2017-08-18 (×2): 100 ug/h via INTRAVENOUS
  Filled 2017-08-17 (×3): qty 250

## 2017-08-17 MED ORDER — LIDOCAINE HCL 1 % IJ SOLN
INTRAMUSCULAR | Status: AC
  Administered 2017-08-17: 16:00:00
  Filled 2017-08-17: qty 20

## 2017-08-17 MED ORDER — FENTANYL BOLUS VIA INFUSION
25.0000 ug | INTRAVENOUS | Status: DC | PRN
  Filled 2017-08-17: qty 25

## 2017-08-17 NOTE — Procedures (Signed)
Chest Tube Procedure Note  Pre-operative Diagnosis: Right pneumothorax  Post-operative Diagnosis: same  Indications: 72 yr old female with acute on chronic respiratory failure from pulmonary fibrosis and COPD developed worsening respiratory status and hypoxia.  Found to have right pneumothorax.  Procedure Details  Consent: Informed consent was obtained. Risks of the procedure were discussed including: infection, bleeding, pain, pneumothorax.  Under sterile conditions the patient was positioned. Betadine solution and sterile drapes were utilized.  1% plain lidocaine was used to anesthetize the 5th rib space on the right mid axillary line.  Skin incision made.  Blunt dissection to intercostal muscle.  Used Kelly clamp to puncture into pleural space.  Finger sweep in chest cavity, and no adhesions.  Insert 20 Fr chest tube and connected to pleurovac.  Sutured in place.  Applied petroleum gel gauze and then bandaged.  Complications:  None; patient tolerated the procedure well.          Condition: stable  Plan Chest xray pending.  Attending Attestation: I performed the procedure.   Coralyn HellingVineet Kadiatou Oplinger, MD [facility] Pulmonary/Critical Care 08/17/2017, 3:50 PM

## 2017-08-17 NOTE — Progress Notes (Signed)
Initial Nutrition Assessment  INTERVENTION:   Tube feeding recommendations: Vital AF 1.2 @ 50 ml/hr to provide 1440 kcal, 90g protein and 973 ml H2O.  NUTRITION DIAGNOSIS:   Inadequate oral intake related to inability to eat as evidenced by NPO status.  GOAL:   Patient will meet greater than or equal to 90% of their needs  MONITOR:   Vent status, Labs, Weight trends, I & O's, Skin  REASON FOR ASSESSMENT:   Ventilator   ASSESSMENT:   72 yo female transferred from [facility] with respiratory failure.  She was hypoxic in ER and required intubation.    Pt intubated 4/5 d/t hypoxia. Per nursing note, unable to place panda at this time. IR to be consulted for tube placement. TF recommendations provided above is tube place and TF desired.  Patient is currently intubated on ventilator support MV: 16.2 L/min Temp (24hrs), Avg:98.3 F (36.8 C), Min:97.9 F (36.6 C), Max:99.1 F (37.3 C)  Propofol: 10 ml/hr -provides 264 kcal  Medications: D5 and .45% NaCl w/ KCl infusion at 50 ml/hr -provides 204 kcal Labs reviewed: Elevated Na Low K TG: 176 mg/dL  NUTRITION - FOCUSED PHYSICAL EXAM:  Nutrition focused physical exam shows no sign of depletion of muscle mass or body fat.  Diet Order:  Diet NPO time specified  EDUCATION NEEDS:   No education needs have been identified at this time  Skin:  Skin Assessment: Skin Integrity Issues: Skin Integrity Issues:: Stage I Stage I: buttocks  Last BM:  PTA  Height:   Ht Readings from Last 1 Encounters:  08/21/2017 5\' 4"  (1.626 m)    Weight:   Wt Readings from Last 1 Encounters:  09/07/2017 123 lb (55.8 kg)    Ideal Body Weight:  54.5 kg  BMI:  Body mass index is 21.11 kg/m.  Estimated Nutritional Needs:   Kcal:  1529  Protein:  80-90g  Fluid:  1.6L/day  Tilda FrancoLindsey Jamez Ambrocio, MS, RD, LDN [facility] Clinical Dietitian Pager: [phone] After Hours Pager: [phone]

## 2017-08-17 NOTE — Progress Notes (Signed)
..  [facility] AD[facility] REPLACEMENT PROTOCOL FOR AM LAB REPLACEMENT ONLY  The patient does apply for the [facility] Electrolyte Replacment Protocol based on the criteria listed below:   1. Is GFR >/= 40 ml/min? Yes.    Patient's GFR today is >60 2. Is urine output >/= 0.5 ml/kg/hr for the last 6 hours? Yes.   Patient's UOP is 1.945 ml/kg/hr 3. Is BUN < 60 mg/dL? Yes.    Patient's BUN today is 13 4. Abnormal electrolyte(s): K+ 3.2  5. Ordered repletion with: protocol 6. If a panic level lab has been reported, has the CCM MD in charge been notified? No..   Physician:  Dr.Sommer   Nancy Potts, Nancy Potts 08/17/2017 5:06 AM

## 2017-08-17 NOTE — Progress Notes (Signed)
Updated pt's family at bedside about sequence of events this afternoon.  Explained she developed pneumothorax in setting of severe lung disease.  Explained her current status is related to progression of her chronic lung disease and that I didn't see any path to getting her better.  Concern is that she will need long term support on vent.  Family was clear she didn't want this.  Her sister (and [place] stated that Ms. Gosselin informed her yesterday that she only wanted a couple of days on the ventilatory, but if she wasn't getting better then to take her off the ventilator and allow her to pass.  DNR order entered.  Will shift focus of care to comfort.  Will keep on vent for now.  The family is trying to contact her other brother.  Plan will then be to proceed with vent withdrawal either later today or tomorrow, but to let her pass peacefully if her status gets worse before family can arrive.  Coralyn HellingVineet Amatullah Christy, MD [facility] Pulmonary/Critical Care 08/17/2017, 4:12 PM

## 2017-08-17 NOTE — Progress Notes (Signed)
Patient placed on +14 and 100%. Patient unable to maintain sats on previous settings. PEEP and FiO2 titrated until O2 sats to 90%. Dr. Craige CottaSood made aware. RT will wean O2 and PEEP as tolerated per verbal order.

## 2017-08-17 NOTE — Progress Notes (Signed)
Attempted to insert number 10 panda/feeding tube unsuccessful attempt x2.

## 2017-08-17 NOTE — Progress Notes (Signed)
PULMONARY / CRITICAL CARE MEDICINE   Name: Nancy Potts MRN: [id number] DOB: 23-Nov-1945    ADMISSION DATE:  09/04/2017  REFERRING MD:  Dr. Erma Heritage, ER  CHIEF COMPLAINT:  Shortness of breath  HISTORY OF PRESENT ILLNESS:   72 yo female transferred from [facility] with respiratory failure.  She was hypoxic in ER and required intubation.  She has advanced IPF and COPD.  Had recent tx for PNA and COPD exacerbation.  PMHx of HTN, HLD, Anxiety.  SUBJECTIVE:  Breathing okay.  Remains on increased FiO2.  VITAL SIGNS: BP (!) 145/58   Pulse 88   Temp 97.9 F (36.6 C)   Resp (!) 30   Ht 5\' 4"  (1.626 m)   Wt 123 lb (55.8 kg)   SpO2 91%   BMI 21.11 kg/m   VENTILATOR SETTINGS: Vent Mode: PCV FiO2 (%):  [70 %-90 %] 70 % Set Rate:  [20 bmp] 20 bmp PEEP:  [8 cmH20] 8 cmH20 Plateau Pressure:  [22 cmH20-28 cmH20] 26 cmH20  INTAKE / OUTPUT: I/O last 3 completed shifts: In: 3029.3 [I.V.:2829.3; IV Piggyback:200] Out: 1025 [Urine:1025]  PHYSICAL EXAMINATION:  General - pleasant Eyes - pupils reactive ENT - no sinus tenderness, no oral exudate, no LAN Cardiac - regular, no murmur Chest - b/l crackles Abd - soft, non tender Ext - no edema Skin - no rashes Neuro - normal strength Psych - normal mood  LABS:  BMET Recent Labs  Lab 08/14/2017 1624 08/16/17 0310 08/17/17 0333  NA 143 146* 147*  K 3.7 3.4* 3.2*  CL 105 115* 116*  CO2 27 22 23   BUN 18 17 13   CREATININE 0.64 0.62 0.60  GLUCOSE 129* 103* 121*    Electrolytes Recent Labs  Lab 09/05/2017 1624 08/16/17 0310 08/17/17 0333  CALCIUM 9.2 8.1* 8.7*    CBC Recent Labs  Lab 08/30/2017 1624 08/16/17 0310 08/17/17 0333  WBC 12.4* 12.7* 8.3  HGB 10.9* 9.1* 8.0*  HCT 34.3* 29.6* 25.8*  PLT 370 375 304    Coag's No results for input(s): APTT, INR in the last 168 hours.  Sepsis Markers Recent Labs  Lab 08/26/2017 1631 08/16/2017 1906 08/16/17 0310 08/17/17 0333  LATICACIDVEN 0.86  --   --   --   PROCALCITON  --   0.10 <0.10 <0.10    ABG Recent Labs  Lab 08/13/2017 1935 08/16/17 0533  PHART 7.378 7.409  PCO2ART 37.0 34.9  PO2ART 208* 91.2    Liver Enzymes Recent Labs  Lab 08/29/2017 1624  AST 16  ALT 11*  ALKPHOS 52  BILITOT 0.8  ALBUMIN 2.7*    Cardiac Enzymes Recent Labs  Lab 08/17/2017 1624  TROPONINI <0.03    Glucose Recent Labs  Lab 08/16/17 1619  GLUCAP 111*    Imaging Ct Angio Chest Pe W Or Wo Contrast  Result Date: 08/16/2017 CLINICAL DATA:  Increasing dyspnea hypoxia EXAM: CT ANGIOGRAPHY CHEST WITH CONTRAST TECHNIQUE: Multidetector CT imaging of the chest was performed using the standard protocol during bolus administration of intravenous contrast. Multiplanar CT image reconstructions and MIPs were obtained to evaluate the vascular anatomy. CONTRAST:  69mL ISOVUE-370 IOPAMIDOL (ISOVUE-370) INJECTION 76% COMPARISON:  Plain film from earlier in the same day. FINDINGS: Cardiovascular: Thoracic aorta demonstrates atherosclerotic calcifications without aneurysm or dissection. No cardiac enlargement is seen. No pericardial effusion is noted. Coronary calcifications are seen. The pulmonary artery shows a normal branching pattern. No focal filling defect to suggest pulmonary embolism is noted. Mild motion artifact limits the examination somewhat. Mediastinum/Nodes:  Thoracic inlet is within normal limits. Scattered small mediastinal lymph nodes are noted regressed from a previous exam. The esophagus is within normal limits. Lungs/Pleura: Endotracheal tube is noted in satisfactory position. The lungs are well aerated bilaterally but again demonstrate diffuse fibrotic changes. Some slight generalized increased density is noted within the lower lobes bilaterally. This could represent some mild superimposed edema. Upper Abdomen: Visualized upper abdomen is within normal limits. Musculoskeletal: Degenerative changes of the thoracic spine are noted. Review of the MIP images confirms the above  findings. IMPRESSION: Fibrotic changes bilaterally. Some diffuse increased density is noted in the lower lobes bilaterally which may represent some superimposed edema. The possibility of mild acute infiltrate cannot be totally excluded on the basis of this exam. No evidence of pulmonary emboli. Aortic Atherosclerosis (ICD10-I70.0). Electronically Signed   By: Alcide CleverMark  Lukens M.D.   On: 08/16/2017 10:44     STUDIES:   CULTURES: Blood 4/05 >>   ANTIBIOTICS: Vancomycin 4/05 >> 4/05 Cefepime 4/05 >> 4/05  SIGNIFICANT EVENTS: 4/05 [facility] from [facility]  LINES/TUBES: ETT 4/05 >>  DISCUSSION: She has acute on chronic respiratory failure in setting of advanced pulmonary fibrosis and COPD.  This appears to be related to natural progression of her disease rather than an acute, reversible inciting event.  She was quite debilitated prior to admission.  She appears very comfortable on vent support.  She is not sure if she would want trach/peg and long term support.  ASSESSMENT / PLAN:  Acute on chronic hypoxic respiratory failure. Advanced pulmonary fibrosis with UIP pattern. COPD. WHO group 2 pulmonary hypertension. - full vent support - oxygen to keep SpO2 88 to 95% - continue solumedrol - scheduled BDs - procalcitonin negative >> hold Abx  Acute metabolic encephalopathy. Hx of CVA, anxiety. - RASS goal 0 to -1 - giving ASA PR until she can get panda placed - hold outpt fluoxetine, gabapentin  Hypotension >> improved - likely from sedation and hypovolemia - continue IV fluids  Anemia. - likely hemodilutional - f/u CBC  Hx of HLD. - hold lipitor until able to get NG/OG tube  Difficulty inserting NG/OG tube. - try to place panda tube - if unsuccessful, will then need to ask radiology to try under fluoro  DVT prophylaxis - lovenox SUP - protonix Nutrition - NPO Goals of care - full code  Had lengthy d/w pt's sister on 4/06.  Explained that her main issue relates to  progression of chronic lung disease, and don't see any reversible source to get her respiratory better then where things are at present.  Pt has 2 additional siblings.  Will need to have further discusses about whether she would want trach/peg and long term vent support.   Coralyn HellingVineet Lenwood Balsam, MD [facility]monary/Critical [facility] 08/17/2017, 10:04 AM

## 2017-08-18 LAB — TRIGLYCERIDES: Triglycerides: 212 mg/dL — ABNORMAL HIGH (ref <150)

## 2017-08-18 MED ORDER — SODIUM CHLORIDE 0.9 % IV SOLN
10.0000 mg/h | INTRAVENOUS | Status: DC
  Administered 2017-08-18: 10 mg/h via INTRAVENOUS
  Filled 2017-08-18: qty 10

## 2017-08-18 MED ORDER — MIDAZOLAM BOLUS VIA INFUSION (WITHDRAWAL LIFE SUSTAINING TX)
5.0000 mg | INTRAVENOUS | Status: DC | PRN
  Administered 2017-08-18: 5 mg via INTRAVENOUS
  Filled 2017-08-18: qty 20

## 2017-08-18 MED ORDER — SODIUM CHLORIDE 0.9 % IV SOLN
100.0000 ug/h | INTRAVENOUS | Status: DC
  Filled 2017-08-18: qty 50

## 2017-08-18 MED ORDER — FENTANYL BOLUS VIA INFUSION
50.0000 ug | INTRAVENOUS | Status: DC | PRN
  Filled 2017-08-18: qty 200

## 2017-08-20 LAB — CULTURE, BLOOD (ROUTINE X 2)
Culture: NO GROWTH
Culture: NO GROWTH
Special Requests: ADEQUATE

## 2017-08-22 ENCOUNTER — Telehealth: Payer: Self-pay

## 2017-08-22 NOTE — Telephone Encounter (Signed)
On 08/22/17 I received a d/c from [employer] ([facility]). The d/c is for burial. The patient is a patient of Doctor McQuaid. The d/c will be taken to Pulmonary Unit for signature.  On 08/25/17 I received the d/c back from Doctor McQuaid. I got the d/c ready and called the funeral home to let them know I mailed the d/c to vital records per the funeral home request.

## 2017-09-10 NOTE — Procedures (Signed)
Extubation Procedure Note  Patient Details:   Name: Nancy Potts DOB: 06/20/1945 MRN: [id number]   Airway Documentation:     Evaluation  O2 sats: transiently fell during during procedure Complications: No apparent complications Patient did not tolerate procedure well. Bilateral Breath Sounds: Rhonchi, Diminished   No  Nancy Potts, Nancy Potts 08/31/2017, 2:02 PM

## 2017-09-10 NOTE — Progress Notes (Signed)
20 mL of versed drip and 205 mL of fentanyl drip wasted in sink with Collie SiadStephanie D, RN.

## 2017-09-10 NOTE — Death Summary Note (Signed)
DEATH SUMMARY   Patient Details  Name: Nancy Potts MRN: [id number] DOB: 1945-07-04  Admission/Discharge Information   Admit Date:  2017-08-23  Date of Death: Date of Death: Aug 26, 2017  Time of Death: Time of Death: Sep 26, 1441  Length of Stay: 3  Referring Physician: Patient, No Pcp Per   Reason(s) for Hospitalization    Diagnoses  Preliminary cause of death:   Pulmonary emphysema Secondary Diagnoses (including complications and co-morbidities):  Active Problems:   Acute on chronic respiratory failure (HCC)   Pressure injury of skin   Brief Hospital Course (including significant findings, care, treatment, and services provided and events leading to death)  Nancy Potts is a 72 y.o. year old female who has a past medical history for advanced fibrotic lung disease and emphysema who came to our facility for dyspnea.  She was noted to have severe acute hypoxemic respiratory failure secondary to the same.  She required intubation for mechanical ventilation and a chest tube for a pneumothorax.  On high level ventilator support her oxygenation remained poor.  The family was updated and we discussed her overall poor prognosis given the advance nature of her lung disease.  They requested that we withdraw care.  She died peacefully as an inpatient.    Pertinent Labs and Studies  Significant Diagnostic Studies Ct Angio Chest Pe W Or Wo Contrast  Result Date: 08/16/2017 CLINICAL DATA:  Increasing dyspnea hypoxia EXAM: CT ANGIOGRAPHY CHEST WITH CONTRAST TECHNIQUE: Multidetector CT imaging of the chest was performed using the standard protocol during bolus administration of intravenous contrast. Multiplanar CT image reconstructions and MIPs were obtained to evaluate the vascular anatomy. CONTRAST:  69mL ISOVUE-370 IOPAMIDOL (ISOVUE-370) INJECTION 76% COMPARISON:  Plain film from earlier in the same day. FINDINGS: Cardiovascular: Thoracic aorta demonstrates atherosclerotic calcifications without  aneurysm or dissection. No cardiac enlargement is seen. No pericardial effusion is noted. Coronary calcifications are seen. The pulmonary artery shows a normal branching pattern. No focal filling defect to suggest pulmonary embolism is noted. Mild motion artifact limits the examination somewhat. Mediastinum/Nodes: Thoracic inlet is within normal limits. Scattered small mediastinal lymph nodes are noted regressed from a previous exam. The esophagus is within normal limits. Lungs/Pleura: Endotracheal tube is noted in satisfactory position. The lungs are well aerated bilaterally but again demonstrate diffuse fibrotic changes. Some slight generalized increased density is noted within the lower lobes bilaterally. This could represent some mild superimposed edema. Upper Abdomen: Visualized upper abdomen is within normal limits. Musculoskeletal: Degenerative changes of the thoracic spine are noted. Review of the MIP images confirms the above findings. IMPRESSION: Fibrotic changes bilaterally. Some diffuse increased density is noted in the lower lobes bilaterally which may represent some superimposed edema. The possibility of mild acute infiltrate cannot be totally excluded on the basis of this exam. No evidence of pulmonary emboli. Aortic Atherosclerosis (ICD10-I70.0). Electronically Signed   By: Alcide Clever M.D.   On: 08/16/2017 10:44   Dg Chest Port 1 View  Result Date: 08/17/2017 CLINICAL DATA:  Chest tube placement, pneumothorax EXAM: PORTABLE CHEST 1 VIEW COMPARISON:  08/17/2017 FINDINGS: Interval placement of a right-sided chest tube. The right-sided pneumothorax is no longer visible. The side port of the chest tube is in the soft tissues of the chest wall. Mild new subcutaneous emphysema in the vicinity of the chest tube. The endotracheal tube tip is only 9 mm above the carina, consider retracting about 2 cm. Atherosclerotic calcification of the aortic arch. Stable bilateral abnormal interstitial accentuation  with some confluent airspace opacity  at the left lung base. Heart size remains within normal limits. IMPRESSION: 1. Right chest tube placement with resolution of the right pneumothorax. The side port of the chest tube is in the soft tissues of the chest wall, there is a small amount of subcutaneous emphysema currently present. 2. Stable bilateral abnormal interstitial accentuation with confluent opacity at the left lung base. This could be from atypical pneumonia or noncardiogenic edema. 3.  Atherosclerotic calcification of the aortic arch. 4. Endotracheal tube tip is only 9 mm above the carina, consider retracting 2 cm. Electronically Signed   By: Gaylyn RongWalter  Liebkemann M.D.   On: 08/17/2017 16:04   Dg Chest Port 1 View  Result Date: 08/17/2017 CLINICAL DATA:  Respiratory failure EXAM: PORTABLE CHEST 1 VIEW COMPARISON:  08/16/2017 FINDINGS: 1459 hours. Endotracheal tube tip is 19 mm above the base of the carina. The diffuse interstitial lung disease again noted. Since prior exam patient has developed a small to moderate right pneumothorax with retraction of the lung apex about 3 cm inferior to the right apical chest wall. Bones are diffusely demineralized. Telemetry leads overlie the chest. IMPRESSION: New right pneumothorax. Critical Value/emergent results were called by me at the time of interpretation on 08/17/2017 at 3:27 pm to the patient's nurse, Amy, who verbally acknowledged these results. Physician was already at bedside placing chest tube. Electronically Signed   By: Kennith CenterEric  Mansell M.D.   On: 08/17/2017 15:28   Dg Chest Port 1 View  Result Date: 08/16/2017 CLINICAL DATA:  Respiratory failure EXAM: PORTABLE CHEST 1 VIEW COMPARISON:  09/04/2017 FINDINGS: Endotracheal tube with tip measuring 2.4 cm above the carina. Shallow inspiration with atelectasis in the lung bases. Heart size is normal. Diffuse interstitial pattern to the lungs likely represents chronic fibrosis, possibly usual interstitial pneumonitis.  No focal consolidation. No blunting of costophrenic angles. No pneumothorax. Calcification of the aorta. IMPRESSION: No change since previous study. Endotracheal tube appears in satisfactory position. Low lung volumes with diffuse fibrosis, likely chronic. Electronically Signed   By: Burman NievesWilliam  Stevens M.D.   On: 08/16/2017 05:54   Dg Chest Portable 1 View  Result Date: 08/17/2017 CLINICAL DATA:  Shortness of breath. EXAM: PORTABLE CHEST 1 VIEW COMPARISON:  Radiograph of June 29, 2017. FINDINGS: The heart size and mediastinal contours are within normal limits. Endotracheal tube is seen projected over tracheal air shadow with distal tip 3 cm above the carina. No pneumothorax or pleural effusion is noted. Stable diffuse reticular densities are noted throughout both lungs consistent with chronic interstitial lung disease, although acute superimposed edema or inflammation cannot be excluded. The visualized skeletal structures are unremarkable. IMPRESSION: Endotracheal tube in grossly good position. Stable reticular densities throughout both lungs consistent with chronic interstitial lung disease, although acute edema or inflammation cannot be excluded. Electronically Signed   By: Lupita RaiderJames  Green Jr, M.D.   On: 08/19/2017 16:56    Microbiology Recent Results (from the past 240 hour(s))  Blood culture (routine x 2)     Status: None   Collection Time: 09/02/2017  4:13 PM  Result Value Ref Range Status   Specimen Description   Final    BLOOD BLOOD RIGHT FOREARM Performed at [facility], [address] [place], [place] [address]    Special Requests   Final    BOTTLES DRAWN AEROBIC ONLY Blood Culture results may not be optimal due to an inadequate volume of blood received in culture bottles Performed at [facility], [address] [place], [place]  [address]    Culture   Final    NO GROWTH 5 DAYS Performed at [facility], [address] [place], [place]  [address]    Report Status 08/20/2017 FINAL  Final  MRSA PCR Screening     Status: None   Collection Time: 09/04/2017  6:10 PM  Result Value Ref Range Status   MRSA by PCR NEGATIVE NEGATIVE Final    Comment:        The GeneXpert MRSA Assay (FDA approved for NASAL specimens only), is one component of a comprehensive MRSA colonization surveillance program. It is not intended to diagnose MRSA infection nor to guide or monitor treatment for MRSA infections. Performed at [facility], [address] [place], [place] [address]   Blood culture (routine x 2)     Status: None   Collection Time: 08/17/2017  7:05 PM  Result Value Ref Range Status   Specimen Description   Final    BLOOD LAC Performed at [facility], [address], [place], [place] [address]    Special Requests   Final    BOTTLES DRAWN AEROBIC AND ANAEROBIC Blood Culture adequate volume Performed at [facility], [address] [place], [place] [address]    Culture   Final    NO GROWTH 5 DAYS Performed at [facility], [address] [place], [place] [address]    Report Status 08/20/2017 FINAL  Final    Lab Basic Metabolic Panel: Recent Labs  Lab 08/21/2017 1624 08/16/17 0310 08/17/17 0333  NA 143 146* 147*  K 3.7 3.4* 3.2*  CL 105 115* 116*  CO2 27 22 23   GLUCOSE 129* 103* 121*  BUN 18 17 13   CREATININE 0.64 0.62 0.60  CALCIUM 9.2 8.1* 8.7*   Liver Function Tests: Recent Labs  Lab 08/27/2017 1624  AST 16  ALT 11*  ALKPHOS 52  BILITOT 0.8  PROT 6.5  ALBUMIN 2.7*   No results for input(s): LIPASE, AMYLASE in the last 168 hours. No results for input(s): AMMONIA in the last 168 hours. CBC: Recent Labs  Lab 08/14/2017 1624 08/16/17 0310 08/17/17 0333  WBC 12.4* 12.7* 8.3  NEUTROABS 11.4*  --   --   HGB 10.9* 9.1* 8.0*  HCT 34.3* 29.6* 25.8*  MCV 88.9 92.2 91.2  PLT 370 375 304   Cardiac Enzymes: Recent Labs  Lab 08/22/2017 1624   TROPONINI <0.03   Sepsis Labs: Recent Labs  Lab 08/25/2017 1624 09/09/2017 1631 09/09/2017 1906 08/16/17 0310 08/17/17 0333  PROCALCITON  --   --  0.10 <0.10 <0.10  WBC 12.4*  --   --  12.7* 8.3  LATICACIDVEN  --  0.86  --   --   --     Procedures/Operations  Intubation Chest tube   [facility] 08/22/2017, 5:46 AM

## 2017-09-10 NOTE — Progress Notes (Signed)
Critical care NP made aware of asystole.

## 2017-09-10 NOTE — Progress Notes (Signed)
PULMONARY / CRITICAL CARE MEDICINE   Name: Nancy Potts MRN: [id number] DOB: 1945/08/28    ADMISSION DATE:  2017/08/27  REFERRING MD:  Dr. Erma Heritage, ER  CHIEF COMPLAINT:  Shortness of breath  HISTORY OF PRESENT ILLNESS:   72 yo female transferred from [facility] with respiratory failure.  She was hypoxic in ER and required intubation.  She has advanced IPF and COPD.  Had recent tx for PNA and COPD exacerbation.  PMHx of HTN, HLD, Anxiety.  SUBJECTIVE:  Family discussion 4/7 with DNR issued.  No acute events overnight.     VITAL SIGNS: BP (!) 81/43   Pulse 74   Temp 98.6 F (37 C)   Resp (!) 25   Ht 5\' 4"  (1.626 m)   Wt 123 lb (55.8 kg)   SpO2 (!) 89%   BMI 21.11 kg/m   VENTILATOR SETTINGS: Vent Mode: PCV FiO2 (%):  [70 %-100 %] 100 % Set Rate:  [20 bmp] 20 bmp PEEP:  [8 cmH20-14 cmH20] 8 cmH20 Plateau Pressure:  [21 cmH20-29 cmH20] 29 cmH20  INTAKE / OUTPUT: I/O last 3 completed shifts: In: 2970.1 [I.V.:2570.1; IV Piggyback:400] Out: 1200 [Urine:1200]  PHYSICAL EXAMINATION: General: frail elderly female, critically ill appearing but NAD  HEENT: MM pink/moist, ETT Neuro: sedate, opens eyes to voice/stimulation  CV: s1s2 rrr, no m/r/g PULM: even/non-labored, lungs bilaterally coarse/rhonchi, bibasilar crackles  JX:BJYN, non-tender, bsx4 active  Extremities: warm/dry, trace generalized edema  Skin: no rashes or lesions  LABS:  BMET Recent Labs  Lab 08-27-2017 1624 08/16/17 0310 08/17/17 0333  NA 143 146* 147*  K 3.7 3.4* 3.2*  CL 105 115* 116*  CO2 27 22 23   BUN 18 17 13   CREATININE 0.64 0.62 0.60  GLUCOSE 129* 103* 121*    Electrolytes Recent Labs  Lab 2017/08/27 1624 08/16/17 0310 08/17/17 0333  CALCIUM 9.2 8.1* 8.7*    CBC Recent Labs  Lab 08/27/17 1624 08/16/17 0310 08/17/17 0333  WBC 12.4* 12.7* 8.3  HGB 10.9* 9.1* 8.0*  HCT 34.3* 29.6* 25.8*  PLT 370 375 304    Coag's No results for input(s): APTT, INR in the last 168 hours.  Sepsis  Markers Recent Labs  Lab 2017/08/27 1631 2017-08-27 1906 08/16/17 0310 08/17/17 0333  LATICACIDVEN 0.86  --   --   --   PROCALCITON  --  0.10 <0.10 <0.10    ABG Recent Labs  Lab 2017/08/27 1935 08/16/17 0533  PHART 7.378 7.409  PCO2ART 37.0 34.9  PO2ART 208* 91.2    Liver Enzymes Recent Labs  Lab 08-27-2017 1624  AST 16  ALT 11*  ALKPHOS 52  BILITOT 0.8  ALBUMIN 2.7*    Cardiac Enzymes Recent Labs  Lab 2017-08-27 1624  TROPONINI <0.03    Glucose Recent Labs  Lab 08/16/17 1619  GLUCAP 111*    Imaging Dg Chest Port 1 View  Result Date: 08/17/2017 CLINICAL DATA:  Chest tube placement, pneumothorax EXAM: PORTABLE CHEST 1 VIEW COMPARISON:  08/17/2017 FINDINGS: Interval placement of a right-sided chest tube. The right-sided pneumothorax is no longer visible. The side port of the chest tube is in the soft tissues of the chest wall. Mild new subcutaneous emphysema in the vicinity of the chest tube. The endotracheal tube tip is only 9 mm above the carina, consider retracting about 2 cm. Atherosclerotic calcification of the aortic arch. Stable bilateral abnormal interstitial accentuation with some confluent airspace opacity at the left lung base. Heart size remains within normal limits. IMPRESSION: 1. Right chest tube  placement with resolution of the right pneumothorax. The side port of the chest tube is in the soft tissues of the chest wall, there is a small amount of subcutaneous emphysema currently present. 2. Stable bilateral abnormal interstitial accentuation with confluent opacity at the left lung base. This could be from atypical pneumonia or noncardiogenic edema. 3.  Atherosclerotic calcification of the aortic arch. 4. Endotracheal tube tip is only 9 mm above the carina, consider retracting 2 cm. Electronically Signed   By: Gaylyn RongWalter  Liebkemann M.D.   On: 08/17/2017 16:04   Dg Chest Port 1 View  Result Date: 08/17/2017 CLINICAL DATA:  Respiratory failure EXAM: PORTABLE CHEST 1  VIEW COMPARISON:  08/16/2017 FINDINGS: 1459 hours. Endotracheal tube tip is 19 mm above the base of the carina. The diffuse interstitial lung disease again noted. Since prior exam patient has developed a small to moderate right pneumothorax with retraction of the lung apex about 3 cm inferior to the right apical chest wall. Bones are diffusely demineralized. Telemetry leads overlie the chest. IMPRESSION: New right pneumothorax. Critical Value/emergent results were called by me at the time of interpretation on 08/17/2017 at 3:27 pm to the patient's nurse, Amy, who verbally acknowledged these results. Physician was already at bedside placing chest tube. Electronically Signed   By: Kennith CenterEric  Mansell M.D.   On: 08/17/2017 15:28    STUDIES:   CULTURES: Blood 4/05 >>   ANTIBIOTICS: Vancomycin 4/05 >> 4/05 Cefepime 4/05 >> 4/05  SIGNIFICANT EVENTS: 4/05 [facility] from [facility]  LINES/TUBES: ETT 4/05 >>  DISCUSSION: She has acute on chronic respiratory failure in setting of advanced pulmonary fibrosis and COPD.  This appears to be related to natural progression of her disease rather than an acute, reversible inciting event.  She was quite debilitated prior to admission.  She appears very comfortable on vent support.  The patient has indicated she would not want trach/peg and long term support.  DNR issued 4/7.    ASSESSMENT / PLAN:  Acute on chronic hypoxic respiratory failure. Right Spontaneous Pneumothorax  Advanced pulmonary fibrosis with UIP pattern. COPD. WHO group 2 pulmonary hypertension. - PCV 14 above PEEP, await family for possible withdrawal of care - FiO2 for sats 88-95% - chest tube to 20 cm suction - chest tube care per protocol  - continue solumedrol  - PRN duoneb  Acute metabolic encephalopathy. Hx of CVA, anxiety. - RASS goal 0 to -1  - hold outpatient fluoxitine, gabapentin - PRN versed, propofol / fentanyl gtt for RASS goal  Hypotension >> improved - D5NS at 50  ml/hr  - ICU monitoring   Anemia. - trend CBC   Hx of HLD. - hold outpatient lipitor   Difficulty inserting NG/OG tube. - hold further attempts at placement for now given plan of care  DVT prophylaxis - lovenox SUP - protonix Nutrition - NPO Goals of care - DNR.  Anticipate withdrawal of care 4/8 pending discussion with family.  Additional brother was to arrive.  Will follow up with family.   Canary BrimBrandi Rolinda Impson, NP-C [facility] Pgr: [phone] or if no answer [phone] 08/31/2017, 8:04 AM

## 2017-09-10 NOTE — Progress Notes (Signed)
Pt was terminally extubated to a 4L [place]. The Pt's O2 SATS dropped to 67% as soon as the ET Tube was pulled. RT placed the Pt's teeth in per family request and when RT left the Pt's room her SATS was 46%.  Nurse brought the family in to sit with the Pt. RT will continue to monitor.

## 2017-09-10 NOTE — Progress Notes (Signed)
Patient went asystole at 243pm. Verified by 2 RNs, myself and Joya SalmGayle S. Family at bedside. Emotional support provided to family.

## 2017-09-10 DEATH — deceased

## 2019-05-31 IMAGING — DX DG CHEST 1V PORT
1 series · 1 of 1 positions shown · non-contrast
Comparison: Radiograph June 29, 2017.

CLINICAL DATA: Shortness of breath.

EXAM:
PORTABLE CHEST 1 VIEW

[chest ap]
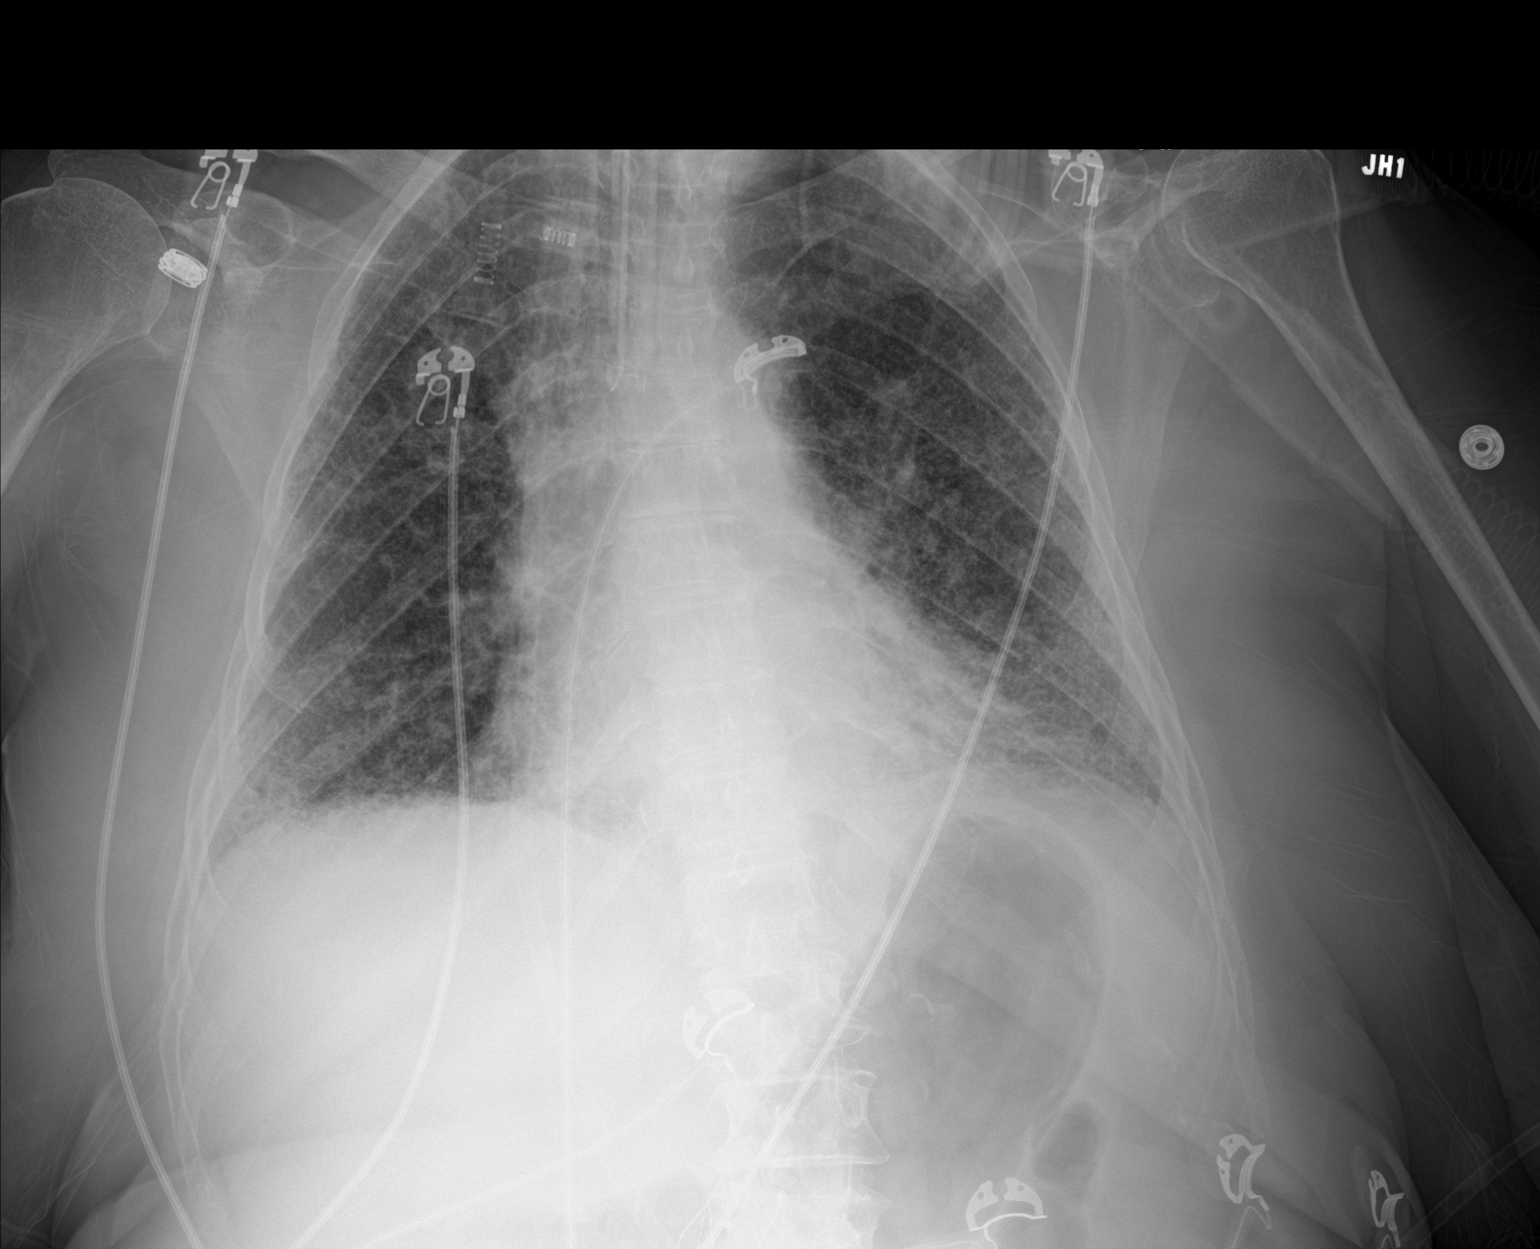

[1 of 1 positions shown; findings below may reference images not displayed]

FINDINGS: The heart size and mediastinal contours are within normal limits.
Endotracheal tube is seen projected over tracheal air shadow with
distal tip 3 cm above the carina. No pneumothorax or pleural
effusion is noted. Stable diffuse reticular densities are noted
throughout both lungs consistent with chronic interstitial lung
disease, although acute superimposed edema or inflammation cannot be
excluded. The visualized skeletal structures are unremarkable.
IMPRESSION: Endotracheal tube in grossly good position. Stable reticular
densities throughout both lungs consistent with chronic interstitial
lung disease, although acute edema or inflammation cannot be
excluded.
